# Patient Record
Sex: Female | Born: 1965 | Hispanic: No | Marital: Married | Smoking: Never smoker
Health system: Southern US, Community
[De-identification: ages and names within clinical notes are randomized; demographics above are authoritative.]

---

## 2002-11-08 ENCOUNTER — Inpatient Hospital Stay (HOSPITAL_COMMUNITY): Admission: RE | Admit: 2002-11-08 | Discharge: 2002-11-10 | Payer: Self-pay | Admitting: Obstetrics and Gynecology

## 2002-11-12 ENCOUNTER — Inpatient Hospital Stay (HOSPITAL_COMMUNITY): Admission: AD | Admit: 2002-11-12 | Discharge: 2002-11-12 | Payer: Self-pay | Admitting: Obstetrics and Gynecology

## 2002-12-26 ENCOUNTER — Emergency Department (HOSPITAL_COMMUNITY): Admission: EM | Admit: 2002-12-26 | Discharge: 2002-12-26 | Payer: Self-pay | Admitting: Emergency Medicine

## 2012-11-21 ENCOUNTER — Ambulatory Visit: Payer: Self-pay

## 2012-11-22 ENCOUNTER — Ambulatory Visit: Payer: No Typology Code available for payment source | Attending: Internal Medicine | Admitting: Internal Medicine

## 2012-11-22 ENCOUNTER — Encounter: Payer: Self-pay | Admitting: Obstetrics & Gynecology

## 2012-11-22 ENCOUNTER — Encounter: Payer: Self-pay | Admitting: Internal Medicine

## 2012-11-22 VITALS — BP 158/89

## 2012-11-22 DIAGNOSIS — K0889 Other specified disorders of teeth and supporting structures: Secondary | ICD-10-CM

## 2012-11-22 DIAGNOSIS — K089 Disorder of teeth and supporting structures, unspecified: Secondary | ICD-10-CM

## 2012-11-22 DIAGNOSIS — K047 Periapical abscess without sinus: Secondary | ICD-10-CM | POA: Insufficient documentation

## 2012-11-22 MED ORDER — IBUPROFEN 600 MG PO TABS: 600.0000 mg | ORAL_TABLET | Freq: Four times a day (QID) | ORAL | Status: DC | PRN

## 2012-11-22 MED ORDER — AMOXICILLIN-POT CLAVULANATE 875-125 MG PO TABS: 1.0000 | ORAL_TABLET | Freq: Two times a day (BID) | ORAL | Status: DC

## 2012-11-22 NOTE — Progress Notes (Signed)
PT HERE TO ESTABLISH CARE FOR DENTAL REFERRAL SWELLING R SIDE OF MOUTH X 2WEEKS WITH PAIN SENSITIVE TO COLD FOODS AFEBRILE. TYLENOL

## 2012-11-22 NOTE — Progress Notes (Signed)
Patient ID: Darrin Nipper, female   DOB: 06/06/1965, 47 y.o.   MRN: [id number] Patient Demographics  Traci Randolph, is a 47 y.o. female  WUJ:[id number]  [id number]:[id number]  DOB - April 23, 1965  Chief Complaint  Patient presents with  . Dental Pain  . Establish Care        Subjective:   Traci Randolph today is here to establish primary care. Patient is a 47 year old female, states has no medical problems, has pain on the right side of the jaw on eating, hot or cold sensation with eating or drinking. Yesterday started having low-grade fevers, facial swelling noted on the right side. Patient has No headache, No chest pain, No abdominal pain - No Nausea, No new weakness tingling or numbness, No Cough - SOB  Objective:    Filed Vitals:   11/22/12 1555  BP: 158/89     ALLERGIES:  No Known Allergies  PAST MEDICAL HISTORY: History reviewed. No pertinent past medical history.  PAST SURGICAL HISTORY: Past Surgical History  Procedure Laterality Date  . Cesarean section      FAMILY HISTORY: History reviewed. No pertinent family history.  MEDICATIONS AT HOME: Prior to Admission medications   Medication Sig Start Date End Date Taking? Authorizing Provider  amoxicillin-clavulanate (AUGMENTIN) 875-125 MG per tablet Take 1 tablet by mouth 2 (two) times daily. X 10 days 11/22/12   Yamin Swingler Jenna Luo, MD  ibuprofen (ADVIL,MOTRIN) 600 MG tablet Take 1 tablet (600 mg total) by mouth every 6 (six) hours as needed for pain. 11/22/12   Adelaida Reindel Jenna Luo, MD    REVIEW OF SYSTEMS:  Constitutional:   No   Fevers, chills, fatigue.  HEENT:    Please see history of present illness  Cardio-vascular: No chest pain,  Orthopnea, swelling in lower extremities, anasarca, palpitations  GI:  No abdominal pain, nausea, vomiting, diarrhea  Resp: No shortness of breath,  No coughing up of blood.No cough.No wheezing.  Skin:  no rash or lesions.  GU:  no dysuria, change in color of urine, no  urgency or frequency.  No flank pain.  Musculoskeletal: No joint pain or swelling.  No decreased range of motion.  No back pain.  Psych: No change in mood or affect. No depression or anxiety.  No memory loss.   Exam  General appearance :Awake, alert, NAD, Speech Clear. HEENT: Atraumatic and Normocephalic, PERLA, dental cavities, facial swelling on the right, right first molar on the top painful Neck: supple, no JVD. No cervical lymphadenopathy.  Chest: clear to auscultation bilaterally, no wheezing, rales or rhonchi CVS: S1 S2 regular, no murmurs.  Abdomen: soft, NBS, NT, ND, no gaurding, rigidity or rebound. Extremities: No cyanosis, clubbing, B/L Lower Ext shows no edema,  Neurology: Awake alert, and oriented X 3, CN II-XII intact, Non focal Skin:No Rash or lesions Wounds: N/A    Data Review   Basic Metabolic Panel: No results found for this basename: NA, K, CL, CO2, GLUCOSE, BUN, CREATININE, CALCIUM, MG, PHOS,  in the last 168 hours Liver Function Tests: No results found for this basename: AST, ALT, ALKPHOS, BILITOT, PROT, ALBUMIN,  in the last 168 hours  CBC: No results found for this basename: WBC, NEUTROABS, HGB, HCT, MCV, PLT,  in the last 168 hours ------------------------------------------------------------------------------------------------------------------ No results found for this basename: HGBA1C,  in the last 72 hours ------------------------------------------------------------------------------------------------------------------ No results found for this basename: CHOL, HDL, LDLCALC, TRIG, CHOLHDL, LDLDIRECT,  in the last 72 hours ------------------------------------------------------------------------------------------------------------------ No results found for this basename: TSH, T4TOTAL, FREET3, T3FREE,  THYROIDAB,  in the last 72 hours ------------------------------------------------------------------------------------------------------------------ No  results found for this basename: VITAMINB12, FOLATE, FERRITIN, TIBC, IRON, RETICCTPCT,  in the last 72 hours  Coagulation profile  No results found for this basename: INR, PROTIME,  in the last 168 hours    Assessment & Plan   Active Problems: Odontalgia with early developing dental abscess - Placed on Augmentin BID x 10days, ibuprofen 600 mg every 6 hours as needed for pain - Will get urgent dental referral  Elevated BP: Likely secondary to the pain - Will recheck blood pressure at the next visit, if still elevated, may need antihypertensive  Health screening - Doesn't want flu shot - States did not have Pap smear, will place ambulatory referral for Pap smear - States she never had mammogram, placed ordered for screening mammogram - CBC, BMET, lipid panel ordered for the next visit   Follow-up in 3 months   Bandon Sherwin M.D. 11/22/2012, 4:07 PM

## 2012-12-27 ENCOUNTER — Encounter: Payer: No Typology Code available for payment source | Admitting: Obstetrics & Gynecology

## 2013-02-05 ENCOUNTER — Ambulatory Visit: Payer: No Typology Code available for payment source | Attending: Internal Medicine

## 2013-02-05 DIAGNOSIS — K047 Periapical abscess without sinus: Secondary | ICD-10-CM

## 2013-02-05 DIAGNOSIS — K0889 Other specified disorders of teeth and supporting structures: Secondary | ICD-10-CM

## 2013-02-05 DIAGNOSIS — Z23 Encounter for immunization: Secondary | ICD-10-CM

## 2013-02-05 LAB — COMPREHENSIVE METABOLIC PANEL
ALT: 12 U/L (ref 0–35)
Albumin: 4.2 g/dL (ref 3.5–5.2)
BUN: 15 mg/dL (ref 6–23)
CO2: 28 mEq/L (ref 19–32)
Calcium: 9.3 mg/dL (ref 8.4–10.5)
Chloride: 103 mEq/L (ref 96–112)
Creat: 0.59 mg/dL (ref 0.50–1.10)
Glucose, Bld: 90 mg/dL (ref 70–99)
Potassium: 3.9 mEq/L (ref 3.5–5.3)
Sodium: 137 mEq/L (ref 135–145)
Total Bilirubin: 0.3 mg/dL (ref 0.3–1.2)
Total Protein: 7.7 g/dL (ref 6.0–8.3)

## 2013-02-05 LAB — CBC WITH DIFFERENTIAL/PLATELET
Eosinophils Relative: 7 % — ABNORMAL HIGH (ref 0–5)
Hemoglobin: 12.3 g/dL (ref 12.0–15.0)
Lymphocytes Relative: 31 % (ref 12–46)
Lymphs Abs: 1.6 10*3/uL (ref 0.7–4.0)
Monocytes Relative: 7 % (ref 3–12)
Neutro Abs: 2.9 10*3/uL (ref 1.7–7.7)
Neutrophils Relative %: 54 % (ref 43–77)
Platelets: 341 10*3/uL (ref 150–400)
RBC: 4.76 MIL/uL (ref 3.87–5.11)
RDW: 14.7 % (ref 11.5–15.5)
WBC: 5.2 10*3/uL (ref 4.0–10.5)

## 2013-02-05 LAB — LIPID PANEL
Cholesterol: 218 mg/dL — ABNORMAL HIGH (ref 0–200)
HDL: 54 mg/dL (ref >39)
LDL Cholesterol: 143 mg/dL — ABNORMAL HIGH (ref 0–99)
Triglycerides: 106 mg/dL (ref <150)
VLDL: 21 mg/dL (ref 0–40)

## 2013-02-26 ENCOUNTER — Ambulatory Visit: Payer: No Typology Code available for payment source

## 2013-02-26 ENCOUNTER — Encounter: Payer: Self-pay | Admitting: Internal Medicine

## 2013-02-26 ENCOUNTER — Ambulatory Visit: Payer: No Typology Code available for payment source | Attending: Internal Medicine | Admitting: Internal Medicine

## 2013-02-26 VITALS — BP 107/68 | HR 74 | Temp 98.8°F | Resp 14 | Ht 61.0 in | Wt 130.2 lb

## 2013-02-26 DIAGNOSIS — R51 Headache: Secondary | ICD-10-CM | POA: Insufficient documentation

## 2013-02-26 DIAGNOSIS — N644 Mastodynia: Secondary | ICD-10-CM

## 2013-02-26 DIAGNOSIS — R002 Palpitations: Secondary | ICD-10-CM

## 2013-02-26 LAB — TSH: TSH: 1.829 u[IU]/mL (ref 0.350–4.500)

## 2013-02-26 MED ORDER — SUMATRIPTAN SUCCINATE 25 MG PO TABS: 25.0000 mg | ORAL_TABLET | ORAL | Status: DC | PRN

## 2013-02-26 NOTE — Progress Notes (Signed)
Pt is here for a f/u visit. Requests a physical check up and lab results form last visit; also cancer screening, pap smear, and check up for heart disease.  Pt is using the interpreter line for communication.

## 2013-02-26 NOTE — Progress Notes (Signed)
Patient ID: Traci Randolph, female   DOB: 1965-10-26, 48 y.o.   MRN: [id number]   CC:  HPI: 48 year-old female, here for routine followup. She wants age-appropriate cancer screening. She is also concerned about having heart disease because her father has valvular heart disease. Extremely poor historian, difficult to obtain history with the use of interpreter line. Only complaint is chronic headaches.   No Known Allergies History reviewed. No pertinent past medical history. Current Outpatient Prescriptions on File Prior to Visit  Medication Sig Dispense Refill  . amoxicillin-clavulanate (AUGMENTIN) 875-125 MG per tablet Take 1 tablet by mouth 2 (two) times daily. X 10 days  20 tablet  0  . ibuprofen (ADVIL,MOTRIN) 600 MG tablet Take 1 tablet (600 mg total) by mouth every 6 (six) hours as needed for pain.  60 tablet  0   No current facility-administered medications on file prior to visit.   History reviewed. No pertinent family history. History   Social History  . Marital Status: Married    Spouse Name: N/A    Number of Children: N/A  . Years of Education: N/A   Occupational History  . Not on file.   Social History Main Topics  . Smoking status: Never Smoker   . Smokeless tobacco: Not on file  . Alcohol Use: No  . Drug Use: No  . Sexual Activity: Not on file   Other Topics Concern  . Not on file   Social History Narrative  . No narrative on file    Review of Systems  Constitutional: Negative for fever, chills, diaphoresis, activity change, appetite change and fatigue.  HENT: Negative for ear pain, nosebleeds, congestion, facial swelling, rhinorrhea, neck pain, neck stiffness and ear discharge.   Eyes: Negative for pain, discharge, redness, itching and visual disturbance.  Respiratory: Negative for cough, choking, chest tightness, shortness of breath, wheezing and stridor.   Cardiovascular: Negative for chest pain, palpitations and leg swelling.  Gastrointestinal:  Negative for abdominal distention.  Genitourinary: Negative for dysuria, urgency, frequency, hematuria, flank pain, decreased urine volume, difficulty urinating and dyspareunia.  Musculoskeletal: Negative for back pain, joint swelling, arthralgias and gait problem.  Neurological: As in history of present illness Hematological: Negative for adenopathy. Does not bruise/bleed easily.  Psychiatric/Behavioral: Negative for hallucinations, behavioral problems, confusion, dysphoric mood, decreased concentration and agitation.    Objective:   Filed Vitals:   02/26/13 1432  BP: 107/68  Pulse: 74  Temp: 98.8 F (37.1 C)  Resp: 14    Physical Exam  Constitutional: Appears well-developed and well-nourished. No distress.  HENT: Normocephalic. External right and left ear normal. Oropharynx is clear and moist.  Eyes: Conjunctivae and EOM are normal. PERRLA, no scleral icterus.  Neck: Normal ROM. Neck supple. No JVD. No tracheal deviation. No thyromegaly.  CVS: RRR, S1/S2 +, no murmurs, no gallops, no carotid bruit.  Pulmonary: Effort and breath sounds normal, no stridor, rhonchi, wheezes, rales.  Abdominal: Soft. BS +,  no distension, tenderness, rebound or guarding.  Musculoskeletal: Normal range of motion. No edema and no tenderness.  Lymphadenopathy: No lymphadenopathy noted, cervical, inguinal. Neuro: Alert. Normal reflexes, muscle tone coordination. No cranial nerve deficit. Skin: Skin is warm and dry. No rash noted. Not diaphoretic. No erythema. No pallor.  Psychiatric: Normal mood and affect. Behavior, judgment, thought content normal.   Lab Results  Component Value Date   WBC 5.2 02/05/2013   HGB 12.3 02/05/2013   HCT 37.7 02/05/2013   MCV 79.2 02/05/2013   PLT 341 02/05/2013  Lab Results  Component Value Date   CREATININE 0.59 02/05/2013   BUN 15 02/05/2013   NA 137 02/05/2013   K 3.9 02/05/2013   CL 103 02/05/2013   CO2 28 02/05/2013    No results found for this  basename: HGBA1C   Lipid Panel     Component Value Date/Time   CHOL 218* 02/05/2013 0936   TRIG 106 02/05/2013 0936   HDL 54 02/05/2013 0936   CHOLHDL 4.0 02/05/2013 0936   VLDL 21 02/05/2013 0936   LDLCALC 143* 02/05/2013 0936       Assessment and plan:   Patient Active Problem List   Diagnosis Date Noted  . Dental abscess 11/22/2012  . Odontalgia 11/22/2012   Patient concerned for heart disease Due to positive family history We'll do a 2-D echo, Vitals normal No personal risk factors such as smoking or dyslipidemia  Chronic headache Once or twice a month Prescribed Imitrex   Routine screening Will schedule for mammogram Schedule for Pap smear Follow up in one month      The patient was given clear instructions to go to ER or return to medical center if symptoms don't improve, worsen or new problems develop. The patient verbalized understanding. The patient was told to call to get any lab results if not heard anything in the next week.

## 2013-02-27 LAB — VITAMIN D 25 HYDROXY (VIT D DEFICIENCY, FRACTURES): Vit D, 25-Hydroxy: 23 ng/mL — ABNORMAL LOW (ref 30–89)

## 2013-03-03 ENCOUNTER — Telehealth: Payer: Self-pay | Admitting: *Deleted

## 2013-03-03 NOTE — Telephone Encounter (Signed)
Message copied by Ragan Reale, [place] on Sat Mar 03, 2013  9:34 AM ------      Message from: Susie CassetteABROL MD, [facility]      Created: Wed Feb 28, 2013  3:26 PM       Please notify the patient the patient vitamin D level 23. The patient should start taking vitamin D 2000 international units twice a day, this is over-the-counter ------

## 2013-03-03 NOTE — Telephone Encounter (Signed)
The contact number that is on file is pt's work number.

## 2013-03-05 ENCOUNTER — Ambulatory Visit (HOSPITAL_COMMUNITY)
Admission: RE | Admit: 2013-03-05 | Discharge: 2013-03-05 | Disposition: A | Discharge status: Home / Self Care | Payer: No Typology Code available for payment source | Source: Ambulatory Visit | Attending: Internal Medicine | Admitting: Internal Medicine

## 2013-03-05 DIAGNOSIS — R002 Palpitations: Secondary | ICD-10-CM

## 2013-03-05 DIAGNOSIS — N644 Mastodynia: Secondary | ICD-10-CM

## 2013-03-05 DIAGNOSIS — R079 Chest pain, unspecified: Secondary | ICD-10-CM | POA: Insufficient documentation

## 2013-03-05 DIAGNOSIS — R072 Precordial pain: Secondary | ICD-10-CM

## 2013-03-05 NOTE — Progress Notes (Signed)
Echocardiogram 2D Echocardiogram has been performed.  Traci Randolph, Traci Randolph 03/05/2013, 11:20 AM

## 2013-03-12 ENCOUNTER — Telehealth: Payer: Self-pay | Admitting: *Deleted

## 2013-03-12 NOTE — Telephone Encounter (Signed)
Unable to leave a voicemail for pt to give us a call back.

## 2013-03-12 NOTE — Telephone Encounter (Signed)
Message copied by Eann Cleland, [place] on Mon Mar 12, 2013  9:50 AM ------      Message from: Susie CassetteABROL MD, [facility]      Created: Fri Mar 09, 2013  3:38 PM       Notify patient of the patient's 2-D echo is normal ------

## 2013-03-13 ENCOUNTER — Ambulatory Visit
Admission: RE | Admit: 2013-03-13 | Discharge: 2013-03-13 | Disposition: A | Discharge status: Home / Self Care | Payer: No Typology Code available for payment source | Source: Ambulatory Visit | Attending: Internal Medicine | Admitting: Internal Medicine

## 2013-03-13 DIAGNOSIS — N644 Mastodynia: Secondary | ICD-10-CM

## 2013-03-13 DIAGNOSIS — R002 Palpitations: Secondary | ICD-10-CM

## 2013-03-30 ENCOUNTER — Encounter: Payer: Self-pay | Admitting: Internal Medicine

## 2013-03-30 ENCOUNTER — Ambulatory Visit: Payer: No Typology Code available for payment source | Attending: Internal Medicine | Admitting: Internal Medicine

## 2013-03-30 VITALS — BP 117/78 | HR 71 | Temp 98.7°F | Resp 14 | Ht 61.0 in | Wt 133.0 lb

## 2013-03-30 DIAGNOSIS — H538 Other visual disturbances: Secondary | ICD-10-CM

## 2013-03-30 DIAGNOSIS — E785 Hyperlipidemia, unspecified: Secondary | ICD-10-CM | POA: Insufficient documentation

## 2013-03-30 DIAGNOSIS — G43909 Migraine, unspecified, not intractable, without status migrainosus: Secondary | ICD-10-CM | POA: Insufficient documentation

## 2013-03-30 DIAGNOSIS — Z139 Encounter for screening, unspecified: Secondary | ICD-10-CM

## 2013-03-30 DIAGNOSIS — R0981 Nasal congestion: Secondary | ICD-10-CM

## 2013-03-30 DIAGNOSIS — J3489 Other specified disorders of nose and nasal sinuses: Secondary | ICD-10-CM | POA: Insufficient documentation

## 2013-03-30 DIAGNOSIS — Z79899 Other long term (current) drug therapy: Secondary | ICD-10-CM | POA: Insufficient documentation

## 2013-03-30 MED ORDER — FLUTICASONE PROPIONATE 50 MCG/ACT NA SUSP: 2.0000 | Freq: Every day | NASAL | Status: DC

## 2013-03-30 NOTE — Progress Notes (Signed)
Pt is here for a routine f/u. Complains that after taking a pain medication she had a possible allergic reaction to the medication which caused some chest pain. Does not know the name of the medication. Also complains of a slight runny nose and lower back pain x2 weeks. Pt is using the interpreter line.

## 2013-03-30 NOTE — Progress Notes (Signed)
MRN: [id number] Name: Traci Randolph  Sex: female Age: 48 y.o. DOB: [birth year]  Allergies: Review of patient's allergies indicates no known allergies.  Chief Complaint  Patient presents with  . Follow-up    routine f/u    HPI: Patient is 48 y.o. female who comes today for followup, history of migraine headaches was prescribed Imitrex as per patient she has not used yet did not required to use it denies any headache currently she reported to have some nasal congestion denies any sore throat chest pain shortness of breath, has problem with the vision and would like to see ophthalmologist.   No past medical history on file.  Past Surgical History  Procedure Laterality Date  . Cesarean section        Medication List       This list is accurate as of: 03/30/13  2:47 PM.  Always use your most recent med list.               amoxicillin-clavulanate 875-125 MG per tablet  Commonly known as:  AUGMENTIN  Take 1 tablet by mouth 2 (two) times daily. X 10 days     fluticasone 50 MCG/ACT nasal spray  Commonly known as:  FLONASE  Place 2 sprays into both nostrils daily.     ibuprofen 600 MG tablet  Commonly known as:  ADVIL,MOTRIN  Take 1 tablet (600 mg total) by mouth every 6 (six) hours as needed for pain.     SUMAtriptan 25 MG tablet  Commonly known as:  IMITREX  Take 1 tablet (25 mg total) by mouth every 2 (two) hours as needed for migraine or headache. May repeat in 2 hours if headache persists or recurs.        Meds ordered this encounter  Medications  . fluticasone (FLONASE) 50 MCG/ACT nasal spray    Sig: Place 2 sprays into both nostrils daily.    Dispense:  16 g    Refill:  2    Immunization History  Administered Date(s) Administered  . Influenza,inj,Quad PF,36+ Mos 02/05/2013    No family history on file.  History  Substance Use Topics  . Smoking status: Never Smoker   . Smokeless tobacco: Not on file  . Alcohol Use: No    Review of  Systems   As noted in HPI  Filed Vitals:   03/30/13 1424  BP: 117/78  Pulse: 71  Temp: 98.7 F (37.1 C)  Resp: 14    Physical Exam  Physical Exam  Constitutional: No distress.  HENT:  Nasal congestion no sinus tenderness   Eyes: EOM are normal. Pupils are equal, round, and reactive to light.  Cardiovascular: Normal rate and regular rhythm.   Pulmonary/Chest: Breath sounds normal. No respiratory distress. She has no wheezes. She has no rales.    CBC    Component Value Date/Time   WBC 5.2 02/05/2013 0936   RBC 4.76 02/05/2013 0936   HGB 12.3 02/05/2013 0936   HCT 37.7 02/05/2013 0936   PLT 341 02/05/2013 0936   MCV 79.2 02/05/2013 0936   LYMPHSABS 1.6 02/05/2013 0936   MONOABS 0.4 02/05/2013 0936   EOSABS 0.3 02/05/2013 0936   BASOSABS 0.0 02/05/2013 0936    CMP     Component Value Date/Time   NA 137 02/05/2013 0936   K 3.9 02/05/2013 0936   CL 103 02/05/2013 0936   CO2 28 02/05/2013 0936   GLUCOSE 90 02/05/2013 0936   BUN 15 02/05/2013 0936   CREATININE  0.59 02/05/2013 0936   CALCIUM 9.3 02/05/2013 0936   PROT 7.7 02/05/2013 0936   ALBUMIN 4.2 02/05/2013 0936   AST 16 02/05/2013 0936   ALT 12 02/05/2013 0936   ALKPHOS 58 02/05/2013 0936   BILITOT 0.3 02/05/2013 0936    Lab Results  Component Value Date/Time   CHOL 218* 02/05/2013  9:36 AM    No components found with this basename: hga1c    Lab Results  Component Value Date/Time   AST 16 02/05/2013  9:36 AM    Assessment and Plan  Nasal congestion - Plan: fluticasone (FLONASE) 50 MCG/ACT nasal spray  Other and unspecified hyperlipidemia I have advised patient for low fat diet.  Blurry vision - Plan: Ambulatory referral to Ophthalmology  Screening/ Pap smear - Plan: Ambulatory referral to Gynecology  Return in about 3 months (around 06/27/2013).  Doris CheadleADVANI, Keithen Capo, MD

## 2013-06-27 ENCOUNTER — Ambulatory Visit: Payer: No Typology Code available for payment source | Admitting: Internal Medicine

## 2013-11-14 ENCOUNTER — Ambulatory Visit: Payer: Self-pay | Attending: Internal Medicine

## 2013-11-20 ENCOUNTER — Ambulatory Visit: Payer: Self-pay | Attending: Internal Medicine | Admitting: *Deleted

## 2013-11-20 DIAGNOSIS — Z23 Encounter for immunization: Secondary | ICD-10-CM | POA: Insufficient documentation

## 2013-11-27 ENCOUNTER — Ambulatory Visit: Payer: Self-pay | Attending: Family Medicine | Admitting: Family Medicine

## 2013-11-27 ENCOUNTER — Encounter: Payer: Self-pay | Admitting: Family Medicine

## 2013-11-27 VITALS — BP 104/66 | HR 75 | Temp 97.5°F | Resp 16 | Ht 61.0 in | Wt 132.0 lb

## 2013-11-27 DIAGNOSIS — Z23 Encounter for immunization: Secondary | ICD-10-CM

## 2013-11-27 DIAGNOSIS — N760 Acute vaginitis: Secondary | ICD-10-CM

## 2013-11-27 DIAGNOSIS — B9689 Other specified bacterial agents as the cause of diseases classified elsewhere: Secondary | ICD-10-CM

## 2013-11-27 DIAGNOSIS — Z7189 Other specified counseling: Secondary | ICD-10-CM

## 2013-11-27 DIAGNOSIS — Z299 Encounter for prophylactic measures, unspecified: Secondary | ICD-10-CM

## 2013-11-27 DIAGNOSIS — A499 Bacterial infection, unspecified: Secondary | ICD-10-CM

## 2013-11-27 DIAGNOSIS — Z01419 Encounter for gynecological examination (general) (routine) without abnormal findings: Secondary | ICD-10-CM | POA: Insufficient documentation

## 2013-11-27 DIAGNOSIS — Z124 Encounter for screening for malignant neoplasm of cervix: Secondary | ICD-10-CM | POA: Insufficient documentation

## 2013-11-27 DIAGNOSIS — Z418 Encounter for other procedures for purposes other than remedying health state: Secondary | ICD-10-CM

## 2013-11-27 DIAGNOSIS — Z1239 Encounter for other screening for malignant neoplasm of breast: Secondary | ICD-10-CM | POA: Insufficient documentation

## 2013-11-27 DIAGNOSIS — Z7184 Encounter for health counseling related to travel: Secondary | ICD-10-CM

## 2013-11-27 NOTE — Patient Instructions (Signed)
Mrs. Traci Randolph,  Thank you for coming in today. Normal pelvic and breat exam today.  I recommend daily calcium and vit D 250-500 mg of calcium with 400 U daily  supplement for bone health.  Regarding travel plans to [place], [place] [facility].  Have a safe trip.   F/u with Dr. Orpah CobbAdvani as needed.   Dr. Armen PickupFunches

## 2013-11-27 NOTE — Assessment & Plan Note (Signed)
A: patient planning to travel [place] on 12/12/13 for 4-5 weeks P: [facility] for [facility] to [place]

## 2013-11-27 NOTE — Progress Notes (Signed)
Annual physical and pap smear  

## 2013-11-27 NOTE — Assessment & Plan Note (Signed)
Pap done today  

## 2013-11-27 NOTE — Assessment & Plan Note (Signed)
A: normal exam.  P: Tdap done today Pap done today Calcium and vit D recommended

## 2013-11-27 NOTE — Progress Notes (Signed)
   Subjective:    Patient ID: Traci Randolph, female    DOB: Apr 15, 1965, 48 y.o.   MRN: [id number] CC: pap smear and breast exam  HPI 48 yo F presents for f/u visit for pap smear and breast exam:  1. Pap smear: no history of abnormal paps. No vaginal discharge. Itching or irritation. Sex with her husband only.   2. Travelling to [place]: leaving on 12/12/13 for 4-5 week trip to [place]  Soc hx: non smoker  Review of Systems As per HPI  No breast pain, mass or skin changes     Objective:   Physical Exam BP 104/66  Pulse 75  Temp(Src) 97.5 F (36.4 C) (Oral)  Resp 16  Ht 5\' 1"  (1.549 m)  Wt 132 lb (59.875 kg)  BMI 24.95 kg/m2  SpO2 98%  LMP 11/01/2013 General appearance: alert, cooperative and no distress Breasts: normal appearance, no masses or tenderness, Inspection negative, No nipple retraction or dimpling, No nipple discharge or bleeding, No axillary or supraclavicular adenopathy, Normal to palpation without dominant masses Pelvic: cervix normal in appearance, external genitalia normal, no adnexal masses or tenderness, no cervical motion tenderness, rectovaginal septum normal, uterus normal size, shape, and consistency and vagina normal without discharge    Assessment & Plan:

## 2013-11-28 LAB — CERVICOVAGINAL ANCILLARY ONLY
Wet Prep (BD Affirm): NEGATIVE
Wet Prep (BD Affirm): NEGATIVE
Wet Prep (BD Affirm): POSITIVE — AB

## 2013-11-29 LAB — CYTOLOGY - PAP

## 2013-11-29 LAB — CERVICOVAGINAL ANCILLARY ONLY
Chlamydia: NEGATIVE
NEISSERIA GONORRHEA: NEGATIVE

## 2013-11-30 DIAGNOSIS — N760 Acute vaginitis: Secondary | ICD-10-CM

## 2013-11-30 DIAGNOSIS — B9689 Other specified bacterial agents as the cause of diseases classified elsewhere: Secondary | ICD-10-CM | POA: Insufficient documentation

## 2013-11-30 MED ORDER — METRONIDAZOLE 500 MG PO TABS: 500.0000 mg | ORAL_TABLET | Freq: Two times a day (BID) | ORAL | Status: DC

## 2013-11-30 MED ORDER — FLUCONAZOLE 150 MG PO TABS: 150.0000 mg | ORAL_TABLET | Freq: Once | ORAL | Status: DC

## 2013-11-30 NOTE — Assessment & Plan Note (Signed)
On screening wet prep Sent flagyl and diflucan to on site pharmacy

## 2013-11-30 NOTE — Addendum Note (Signed)
Addended by: Dessa PhiFUNCHES, Sakura Denis on: 11/30/2013 11:49 AM   Modules accepted: Orders

## 2013-12-03 ENCOUNTER — Telehealth: Payer: Self-pay | Admitting: *Deleted

## 2013-12-03 NOTE — Telephone Encounter (Signed)
Message copied by Dyann KiefGIRALDEZ, Inez Stantz M on Mon Dec 03, 2013  2:13 PM ------      Message from: Dessa PhiFUNCHES, JOSALYN      Created: Fri Nov 30, 2013  6:43 PM       Negative pap repeat in 3 years ------

## 2013-12-03 NOTE — Telephone Encounter (Signed)
Pt aware of results. Used  [place] interpreter # [phone].   Notes Recorded by Lora PaulaJosalyn C Funches, MD on 11/30/2013 at 6:43 PM Negative pap repeat in 3 years Notes Recorded by Lora PaulaJosalyn C Funches, MD on 11/30/2013 at 11:47 AM BV on wet prep.  Sent in flagyl and diflucan

## 2013-12-07 ENCOUNTER — Encounter: Payer: Self-pay | Admitting: Internal Medicine

## 2013-12-07 ENCOUNTER — Telehealth: Payer: Self-pay | Admitting: *Deleted

## 2014-04-16 NOTE — Telephone Encounter (Signed)
error 

## 2014-04-26 ENCOUNTER — Ambulatory Visit: Payer: Self-pay | Attending: Internal Medicine | Admitting: Family Medicine

## 2014-04-26 VITALS — BP 88/50 | HR 61 | Temp 98.3°F | Resp 16 | Ht 61.0 in | Wt 126.8 lb

## 2014-04-26 DIAGNOSIS — R3911 Hesitancy of micturition: Secondary | ICD-10-CM | POA: Insufficient documentation

## 2014-04-26 DIAGNOSIS — Z87898 Personal history of other specified conditions: Secondary | ICD-10-CM

## 2014-04-26 DIAGNOSIS — Z87448 Personal history of other diseases of urinary system: Secondary | ICD-10-CM

## 2014-04-26 DIAGNOSIS — R34 Anuria and oliguria: Secondary | ICD-10-CM | POA: Insufficient documentation

## 2014-04-26 LAB — POCT URINALYSIS DIPSTICK
Bilirubin, UA: NEGATIVE
Glucose, UA: NEGATIVE
Ketones, UA: NEGATIVE
Leukocytes, UA: NEGATIVE
Nitrite, UA: NEGATIVE
PROTEIN UA: NEGATIVE
Spec Grav, UA: >=1.03
UROBILINOGEN UA: 0.2
pH, UA: 5.5

## 2014-04-26 NOTE — Assessment & Plan Note (Signed)
Her urine is clear and clean no evidence or any type of infection. I have noted that her blood pressure is 88 systolically today. Our plan is to have her increase her liquids I suggested trying to drink at least 8 glasses of liquids per day. And if this does not settle the issue that she should follow-up with her primary doctor here at the Center.

## 2014-04-26 NOTE — Patient Instructions (Signed)
Drink more fluids, especially water. This may correct the problem. You do not have a urinary tract infection. If drinking more does not help, make an appointment with your primary doctor here.

## 2014-04-26 NOTE — Progress Notes (Signed)
[facility] Interpeter present  Patient report trouble emptying her bladder for several months.  She reports it might take 5-10 minutes to empty her bladder.  She complains of no pain with urination

## 2015-02-13 ENCOUNTER — Emergency Department (HOSPITAL_COMMUNITY)
Admission: EM | Admit: 2015-02-13 | Discharge: 2015-02-13 | Disposition: A | Discharge status: Home / Self Care | Payer: Medicaid Other | Source: Home / Self Care | Attending: Family Medicine | Admitting: Family Medicine

## 2015-02-13 ENCOUNTER — Encounter (HOSPITAL_COMMUNITY): Payer: Self-pay | Admitting: Emergency Medicine

## 2015-02-13 DIAGNOSIS — J069 Acute upper respiratory infection, unspecified: Secondary | ICD-10-CM

## 2015-02-13 LAB — POCT RAPID STREP A: STREPTOCOCCUS, GROUP A SCREEN (DIRECT): NEGATIVE

## 2015-02-13 MED ORDER — GUAIFENESIN ER 600 MG PO TB12: 600.0000 mg | ORAL_TABLET | Freq: Two times a day (BID) | ORAL | Status: AC

## 2015-02-13 NOTE — ED Provider Notes (Addendum)
CSN: [id number]     Arrival date & time 02/13/15  1316 History   First MD Initiated Contact with Patient 02/13/15 1353     Chief Complaint  Patient presents with  . Cough   (Consider location/radiation/quality/duration/timing/severity/associated sxs/prior Treatment) Patient is a 49 y.o. female presenting with cough. The history is provided by the patient. No language interpreter was used.  Cough Associated symptoms: sore throat   Associated symptoms: no chills, no diaphoresis, no fever, no shortness of breath and no wheezing    Patient with complaint of 2-3 days of nonproductive cough, watery rhinorrhea, and sore throat.  Denies fevers or chills. Took 2 aspirin tablets yesterday without relief. Denies shortness of breath, ear pain, chest pain, nausea/vomiting, diarrhea, or other symptoms. Reports NKDA.   Social Hx; Nonsmoker. No ill contacts.   History reviewed. No pertinent past medical history. Past Surgical History  Procedure Laterality Date  . Cesarean section     No family history on file. Social History  Substance Use Topics  . Smoking status: Never Smoker   . Smokeless tobacco: None  . Alcohol Use: No   OB History    No data available     Review of Systems  Constitutional: Negative for fever, chills, diaphoresis and activity change.  HENT: Positive for postnasal drip and sore throat. Negative for trouble swallowing.   Respiratory: Positive for cough. Negative for chest tightness, shortness of breath and wheezing.   Gastrointestinal: Negative for diarrhea and constipation.  All other systems reviewed and are negative.   Allergies  Review of patient's allergies indicates no known allergies.  Home Medications   Prior to Admission medications   Not on File   Meds Ordered and Administered this Visit  Medications - No data to display  BP 122/66 mmHg  Pulse 60  Temp(Src) 98.2 F (36.8 C) (Oral)  Resp 16  SpO2 100% No data found.   Physical Exam   Constitutional: She appears well-developed and well-nourished. No distress.  HENT:  Head: Normocephalic and atraumatic.  Right Ear: External ear normal.  Left Ear: External ear normal.  Moist mucus membranes.  Injected oropharynx without exudates.    Eyes: Conjunctivae and EOM are normal. Pupils are equal, round, and reactive to light. Right eye exhibits no discharge. Left eye exhibits no discharge.  Neck: Normal range of motion. Neck supple. No tracheal deviation present. No thyromegaly present.  Cardiovascular: Normal rate, regular rhythm and normal heart sounds.   Pulmonary/Chest: Effort normal and breath sounds normal. No respiratory distress. She has no wheezes. She has no rales. She exhibits no tenderness.  Lymphadenopathy:    She has no cervical adenopathy.  Skin: She is not diaphoretic.    ED Course  Procedures (including critical care time)  Labs Review Labs Reviewed  POCT RAPID STREP A    Imaging Review No results found.   Visual Acuity Review  Right Eye Distance:   Left Eye Distance:   Bilateral Distance:    Right Eye Near:   Left Eye Near:    Bilateral Near:         MDM  No diagnosis found. Patient with 2-3 days of upper respiratory symptoms, no findings suggestive of PNA, sinusitis, otitis media, or other identifiable infectious causes.  Supportive therapy, [facility].     Barbaraann BarthelJames O Christopher Glasscock, MD 02/13/15 1406  Barbaraann BarthelJames O Achille Xiang, MD 02/13/15 [id number]

## 2015-02-13 NOTE — Discharge Instructions (Signed)
It is a pleasure to see you today. I believe you have a viral respiratory infection.   I recommend taking Mucinex 600mg  tablets, take 1 tablet by mouth twice daily, to thin mucus and make it easier to get out.   Nasal saline spray, in each nostril throughout the day, to clean out nose.  Over-the-counter.   Warm teas, vaporizer, over-the-counter throat lozenges or Chloraseptic throat spray as needed to help with symptoms.   Follow up with your primary doctor or return to urgent care center if you start feeling worse, start with fevers/chills, or with other concerns.

## 2015-02-13 NOTE — ED Notes (Signed)
Here with cough, runny nose and sore throat x 2-3 days Taking Asprin without relief  Rapid strep obtained

## 2015-02-15 LAB — CULTURE, GROUP A STREP: Strep A Culture: NEGATIVE

## 2019-09-27 ENCOUNTER — Encounter (HOSPITAL_COMMUNITY): Payer: Self-pay

## 2019-09-27 ENCOUNTER — Emergency Department (HOSPITAL_COMMUNITY)
Admission: EM | Admit: 2019-09-27 | Discharge: 2019-09-28 | Disposition: A | Discharge status: Home / Self Care | Payer: No Typology Code available for payment source | Attending: Emergency Medicine | Admitting: Emergency Medicine

## 2019-09-27 ENCOUNTER — Emergency Department (HOSPITAL_COMMUNITY): Payer: No Typology Code available for payment source

## 2019-09-27 DIAGNOSIS — M542 Cervicalgia: Secondary | ICD-10-CM | POA: Diagnosis not present

## 2019-09-27 DIAGNOSIS — Y9389 Activity, other specified: Secondary | ICD-10-CM | POA: Diagnosis not present

## 2019-09-27 DIAGNOSIS — Y998 Other external cause status: Secondary | ICD-10-CM | POA: Insufficient documentation

## 2019-09-27 DIAGNOSIS — Z124 Encounter for screening for malignant neoplasm of cervix: Secondary | ICD-10-CM | POA: Insufficient documentation

## 2019-09-27 DIAGNOSIS — Y9289 Other specified places as the place of occurrence of the external cause: Secondary | ICD-10-CM | POA: Diagnosis not present

## 2019-09-27 DIAGNOSIS — M79642 Pain in left hand: Secondary | ICD-10-CM

## 2019-09-27 DIAGNOSIS — S161XXA Strain of muscle, fascia and tendon at neck level, initial encounter: Secondary | ICD-10-CM

## 2019-09-27 DIAGNOSIS — S199XXA Unspecified injury of neck, initial encounter: Secondary | ICD-10-CM | POA: Diagnosis present
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
## 2019-09-28 NOTE — ED Notes (Signed)
Patient verbalizes understanding of discharge instructions. Opportunity for questioning and answers were provided. Arm band removed by staff, patient discharged from ED. 
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
## 2019-09-28 NOTE — Discharge Instructions (Addendum)
You can take 600 mg of ibuprofen every 6 hours, you can take 1000 mg of Tylenol every 6 hours, you can alternate these every 3 or you can take them together.  
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
